# Patient Record
Sex: Female | Born: 1995 | Race: White | Hispanic: No | Marital: Single | Smoking: Never smoker
Health system: Southern US, Community
[De-identification: ages and names within clinical notes are randomized; demographics above are authoritative.]

---

## 2019-07-04 ENCOUNTER — Encounter (HOSPITAL_BASED_OUTPATIENT_CLINIC_OR_DEPARTMENT_OTHER): Payer: Self-pay

## 2019-07-04 ENCOUNTER — Emergency Department (HOSPITAL_BASED_OUTPATIENT_CLINIC_OR_DEPARTMENT_OTHER)
Admission: EM | Admit: 2019-07-04 | Discharge: 2019-07-05 | Disposition: A | Discharge status: Home / Self Care | Payer: Medicaid Other | Attending: Emergency Medicine | Admitting: Emergency Medicine

## 2019-07-04 ENCOUNTER — Emergency Department (HOSPITAL_BASED_OUTPATIENT_CLINIC_OR_DEPARTMENT_OTHER): Payer: Medicaid Other

## 2019-07-04 ENCOUNTER — Other Ambulatory Visit: Payer: Self-pay

## 2019-07-04 DIAGNOSIS — N12 Tubulo-interstitial nephritis, not specified as acute or chronic: Secondary | ICD-10-CM | POA: Insufficient documentation

## 2019-07-04 DIAGNOSIS — M549 Dorsalgia, unspecified: Secondary | ICD-10-CM | POA: Diagnosis present

## 2019-07-04 LAB — COMPREHENSIVE METABOLIC PANEL
ALT: 15 U/L (ref 0–44)
AST: 19 U/L (ref 15–41)
Albumin: 4.3 g/dL (ref 3.5–5.0)
Alkaline Phosphatase: 74 U/L (ref 38–126)
Anion gap: 10 (ref 5–15)
BUN: 11 mg/dL (ref 6–20)
CO2: 23 mmol/L (ref 22–32)
Calcium: 9 mg/dL (ref 8.9–10.3)
Chloride: 105 mmol/L (ref 98–111)
Creatinine, Ser: 0.65 mg/dL (ref 0.44–1.00)
GFR calc Af Amer: >60 mL/min (ref >60)
GFR calc non Af Amer: >60 mL/min (ref >60)
Glucose, Bld: 87 mg/dL (ref 70–99)
Potassium: 3.6 mmol/L (ref 3.5–5.1)
Sodium: 138 mmol/L (ref 135–145)
Total Bilirubin: 0.4 mg/dL (ref 0.3–1.2)
Total Protein: 7.6 g/dL (ref 6.5–8.1)

## 2019-07-04 LAB — URINALYSIS, MICROSCOPIC (REFLEX): WBC, UA: >50 WBC/hpf (ref 0–5)

## 2019-07-04 LAB — CBC WITH DIFFERENTIAL/PLATELET
Abs Immature Granulocytes: 0.04 10*3/uL (ref 0.00–0.07)
Basophils Absolute: 0.1 10*3/uL (ref 0.0–0.1)
Basophils Relative: 0 %
Eosinophils Absolute: 0.1 10*3/uL (ref 0.0–0.5)
Eosinophils Relative: 1 %
HCT: 40.1 % (ref 36.0–46.0)
Hemoglobin: 13.2 g/dL (ref 12.0–15.0)
Immature Granulocytes: 0 %
Lymphocytes Relative: 24 %
Lymphs Abs: 3 10*3/uL (ref 0.7–4.0)
MCH: 27.6 pg (ref 26.0–34.0)
MCHC: 32.9 g/dL (ref 30.0–36.0)
MCV: 83.7 fL (ref 80.0–100.0)
Monocytes Absolute: 0.6 10*3/uL (ref 0.1–1.0)
Monocytes Relative: 5 %
Neutro Abs: 8.7 10*3/uL — ABNORMAL HIGH (ref 1.7–7.7)
Neutrophils Relative %: 70 %
Platelets: 337 10*3/uL (ref 150–400)
RBC: 4.79 MIL/uL (ref 3.87–5.11)
RDW: 13.2 % (ref 11.5–15.5)
WBC: 12.4 10*3/uL — ABNORMAL HIGH (ref 4.0–10.5)
nRBC: 0 % (ref 0.0–0.2)

## 2019-07-04 LAB — PREGNANCY, URINE: Preg Test, Ur: NEGATIVE

## 2019-07-04 LAB — URINALYSIS, ROUTINE W REFLEX MICROSCOPIC
Bilirubin Urine: NEGATIVE
Glucose, UA: NEGATIVE mg/dL
Ketones, ur: NEGATIVE mg/dL
Nitrite: NEGATIVE
Protein, ur: 30 mg/dL — AB
Specific Gravity, Urine: 1.025 (ref 1.005–1.030)
pH: 6 (ref 5.0–8.0)

## 2019-07-04 MED ORDER — ONDANSETRON HCL 4 MG/2ML IJ SOLN
4.0000 mg | Freq: Once | INTRAMUSCULAR | Status: AC
  Administered 2019-07-04: 4 mg via INTRAVENOUS
  Filled 2019-07-04: qty 2

## 2019-07-04 MED ORDER — OXYCODONE-ACETAMINOPHEN 5-325 MG PO TABS
1.0000 | ORAL_TABLET | Freq: Once | ORAL | Status: AC
  Administered 2019-07-04: 21:00:00 1 via ORAL
  Filled 2019-07-04: qty 1

## 2019-07-04 MED ORDER — FENTANYL CITRATE (PF) 100 MCG/2ML IJ SOLN
100.0000 ug | Freq: Once | INTRAMUSCULAR | Status: AC
  Administered 2019-07-04: 100 ug via INTRAVENOUS
  Filled 2019-07-04: qty 2

## 2019-07-04 MED ORDER — CEPHALEXIN 500 MG PO CAPS: 500.0000 mg | ORAL_CAPSULE | Freq: Four times a day (QID) | ORAL | 0 refills | Status: AC

## 2019-07-04 MED ORDER — SODIUM CHLORIDE 0.9 % IV SOLN
1.0000 g | Freq: Once | INTRAVENOUS | Status: AC
  Administered 2019-07-04: 1 g via INTRAVENOUS
  Filled 2019-07-04: qty 10

## 2019-07-04 NOTE — ED Notes (Signed)
Ambulatory to treatment room

## 2019-07-04 NOTE — ED Triage Notes (Signed)
Pt reports that she only has a left kidney, states that she has had and passed a kidney stone in the past and this pain feels the same. Also reports that the pain is worse with urination.

## 2019-07-04 NOTE — ED Notes (Signed)
Pt reports using azo with no relief.

## 2019-07-04 NOTE — ED Notes (Signed)
ED Provider at bedside. 

## 2019-07-04 NOTE — ED Notes (Signed)
Patient transported to CT 

## 2019-07-04 NOTE — ED Provider Notes (Signed)
[facility] EMERGENCY DEPARTMENT Provider Note   CSN: [id number] Arrival date & time: 07/04/19  1706     History Chief Complaint  Patient presents with  . Back Pain    Kelly Duffy is a 23 y.o. female.  23 year old female with past medical history including kidney stone, congenital solitary kidney who presents with left flank pain.  Patient states that approximately 1 week ago she began having left flank plain as well as pain at the end of urination.  She is also noticed intermittent hematuria.  She reports that symptoms have been worsening since they began.  She has had nausea, no vomiting.  She has had chills but no measured fevers.  She denies any URI symptoms.  She has a distant history of kidney stone while pregnant and states that this feels similar.  When her symptoms first started, she started taking Azo thinking that it was a UTI but symptoms have progressed since then.  The history is provided by the patient.  Back Pain      History reviewed. No pertinent past medical history.  There are no problems to display for this patient.   History reviewed. No pertinent surgical history.   OB History   No obstetric history on file.     History reviewed. No pertinent family history.  Social History   Tobacco Use  . Smoking status: Never Smoker  . Smokeless tobacco: Never Used  Substance Use Topics  . Alcohol use: Never  . Drug use: Never    Home Medications Prior to Admission medications   Medication Sig Start Date End Date Taking? Authorizing Provider  cephALEXin (KEFLEX) 500 MG capsule Take 1 capsule (500 mg total) by mouth 4 (four) times daily for 10 days. 07/04/19 07/14/19  Miray Mancino, Ambrose Finlandachel Morgan, MD    Allergies    Medroxyprogesterone acetate, Metronidazole, and Medroxyprogesterone  Review of Systems   Review of Systems  Musculoskeletal: Positive for back pain.   All other systems reviewed and are negative except that which was mentioned  in HPI  Physical Exam Updated Vital Signs BP (!) 103/48 (BP Location: Right Arm)   Pulse 80   Temp 98.4 F (36.9 C) (Oral)   Resp 20   Ht 5\' 9"  (1.753 m)   Wt 94.3 kg   LMP 06/27/2019 Comment: (-) urine prg//ac  SpO2 100%   BMI 30.72 kg/m   Physical Exam Vitals and nursing note reviewed.  Constitutional:      General: She is not in acute distress.    Appearance: She is well-developed.  HENT:     Head: Normocephalic and atraumatic.  Eyes:     Conjunctiva/sclera: Conjunctivae normal.  Cardiovascular:     Rate and Rhythm: Normal rate and regular rhythm.     Heart sounds: Normal heart sounds. No murmur.  Pulmonary:     Effort: Pulmonary effort is normal.     Breath sounds: Normal breath sounds.  Abdominal:     General: Bowel sounds are normal. There is no distension.     Palpations: Abdomen is soft.     Tenderness: There is no abdominal tenderness. There is left CVA tenderness.  Musculoskeletal:     Cervical back: Neck supple.  Skin:    General: Skin is warm and dry.  Neurological:     Mental Status: She is alert and oriented to person, place, and time.     Comments: Fluent speech  Psychiatric:        Judgment: Judgment normal.  ED Results / Procedures / Treatments   Labs (all labs ordered are listed, but only abnormal results are displayed) Labs Reviewed  URINALYSIS, ROUTINE W REFLEX MICROSCOPIC - Abnormal; Notable for the following components:      Result Value   APPearance CLOUDY (*)    Hgb urine dipstick MODERATE (*)    Protein, ur 30 (*)    Leukocytes,Ua SMALL (*)    All other components within normal limits  URINALYSIS, MICROSCOPIC (REFLEX) - Abnormal; Notable for the following components:   Bacteria, UA FEW (*)    All other components within normal limits  CBC WITH DIFFERENTIAL/PLATELET - Abnormal; Notable for the following components:   WBC 12.4 (*)    Neutro Abs 8.7 (*)    All other components within normal limits  URINE CULTURE  PREGNANCY,  URINE  COMPREHENSIVE METABOLIC PANEL    EKG None  Radiology CT Renal Stone Study  Result Date: 07/04/2019 CLINICAL DATA:  Left flank pain EXAM: CT ABDOMEN AND PELVIS WITHOUT CONTRAST TECHNIQUE: Multidetector CT imaging of the abdomen and pelvis was performed following the standard protocol without IV contrast. COMPARISON:  None. FINDINGS: Lower chest: The visualized heart size within normal limits. No pericardial fluid/thickening. No hiatal hernia. The visualized portions of the lungs are clear. Hepatobiliary: Although limited due to the lack of intravenous contrast, normal in appearance without gross focal abnormality. No evidence of calcified gallstones or biliary ductal dilatation. Pancreas:  Unremarkable.  No surrounding inflammatory changes. Spleen: Normal in size. Although limited due to the lack of intravenous contrast, normal in appearance. Adrenals/Urinary Tract: Both adrenal glands appear normal. There is a punctate 2 mm calculus seen in the lower pole of the left kidney. No hydronephrosis is seen. No collecting system or bladder calculi are seen. There is congenital absence of the right kidney. Stomach/Bowel: The stomach, small bowel, and colon are normal in appearance. No inflammatory changes or obstructive findings. appendix is normal. Vascular/Lymphatic: There are no enlarged abdominal or pelvic lymph nodes. No significant gross vascular findings are present. 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 Other: No evidence of abdominal wall mass or hernia. Musculoskeletal: No acute or significant osseous findings. IMPRESSION: Nonobstructing 2 mm left renal calculus. Congenital absence of the right kidney. Electronically Signed   By: Jonna Clark M.D.   On: 07/04/2019 20:31    Procedures Procedures (including critical care time)  Medications Ordered in ED Medications  fentaNYL (SUBLIMAZE) injection 100 mcg (100 mcg Intravenous Given 07/04/19 1941)  ondansetron  (ZOFRAN) injection 4 mg (4 mg Intravenous Given 07/04/19 1941)  cefTRIAXone (ROCEPHIN) 1 g in sodium chloride 0.9 % 100 mL IVPB (0 g Intravenous Stopped 07/04/19 2133)  oxyCODONE-acetaminophen (PERCOCET/ROXICET) 5-325 MG per tablet 1 tablet (1 tablet Oral Given 07/04/19 2108)    ED Course  I have reviewed the triage vital signs and the nursing notes.  Pertinent labs & imaging results that were available during my care of the patient were reviewed by me and considered in my medical decision making (see chart for details).    MDM Rules/Calculators/A&P                      Afebrile and well-appearing on exam, left CVA tenderness noted.  Differential includes kidney stone, pyelonephritis, infected stone, less likely diverticulitis.  UA shows small leukocytes, large amount of WBCs and clumps with a few bacteria.  Gave ceftriaxone.  Obtain CT renal study to evaluate for infected stone.  CT shows no ureteral stones  or hydronephrosis.  Patient well-appearing on reassessment.  Her vital signs have remained normal here and she has been afebrile.  I had a long discussion with the patient and her mother (over the phone) regarding treatment options.  I discussed that because she has solitary kidney and has symptoms suggestive of pyelonephritis, we could admit her overnight for observation with continued antibiotics.  Alternatively, I discussed risks and benefits of treatment at home with close PCP follow-up and return if any symptoms worsen.  They voiced understanding of options and risks/benefits and ultimately patient decided to go home with outpatient treatment.  I have provided with Keflex and emphasized the importance of return precautions.  Patient voiced understanding. Final Clinical Impression(s) / ED Diagnoses Final diagnoses:  Pyelonephritis of left kidney    Rx / DC Orders ED Discharge Orders         Ordered    cephALEXin (KEFLEX) 500 MG capsule  4 times daily     07/04/19 2313             Kaylena Pacifico, Wenda Overland, MD 07/04/19 2315

## 2019-07-07 LAB — URINE CULTURE: Culture: >=100000 — AB

## 2019-07-08 ENCOUNTER — Telehealth: Payer: Self-pay | Admitting: Emergency Medicine

## 2019-07-08 NOTE — Progress Notes (Signed)
ED Antimicrobial Stewardship Positive Culture Follow Up   Kelly Duffy is an 23 y.o. female who presented to [facility] on 07/04/2019 with a chief complaint of  Chief Complaint  Patient presents with   Back Pain    Recent Results (from the past 720 hour(s))  Urine culture     Status: Abnormal   Collection Time: 07/04/19  5:25 PM   Specimen: Urine, Random  Result Value Ref Range Status   Specimen Description   Final    URINE, RANDOM Performed at [facility], [address] [place], [place] [address]    Special Requests   Final    NONE Performed at [facility], [address] [place], [place] [address]    Culture >=100,000 COLONIES/mL STAPHYLOCOCCUS SAPROPHYTICUS (A)  Final   Report Status 07/07/2019 FINAL  Final   Organism ID, Bacteria STAPHYLOCOCCUS SAPROPHYTICUS (A)  Final      Susceptibility   Staphylococcus saprophyticus - MIC*    CIPROFLOXACIN <=0.5 SENSITIVE Sensitive     GENTAMICIN <=0.5 SENSITIVE Sensitive     NITROFURANTOIN <=16 SENSITIVE Sensitive     OXACILLIN 2 RESISTANT Resistant     TETRACYCLINE <=1 SENSITIVE Sensitive     VANCOMYCIN <=0.5 SENSITIVE Sensitive     TRIMETH/SULFA <=10 SENSITIVE Sensitive     CLINDAMYCIN >=8 RESISTANT Resistant     RIFAMPIN <=0.5 SENSITIVE Sensitive     Inducible Clindamycin NEGATIVE Sensitive     * >=100,000 COLONIES/mL STAPHYLOCOCCUS SAPROPHYTICUS    [x]  Treated with Keflex, organism resistant to prescribed antimicrobial  New antibiotic prescription: Stop Keflex. Start Bactrim 1 DS tablet PO BID x 14 days  ED Provider: Benedetto Goad, PA-C   [name] 07/08/2019, 7:57 AM Clinical Pharmacist Monday - Friday phone -  [phone] Saturday - Sunday phone - [phone]

## 2019-07-08 NOTE — Telephone Encounter (Signed)
Post ED Visit - Positive Culture Follow-up: Successful Patient Follow-Up  Culture assessed and recommendations reviewed by:  []  Elenor Quinones, Pharm.D. []  Heide Guile, Pharm.D., BCPS AQ-ID []  Parks Neptune, Pharm.D., BCPS []  Alycia Rossetti, Pharm.D., BCPS []  [place], Pharm.D., BCPS, AAHIVP []  Legrand Como, Pharm.D., BCPS, AAHIVP []  Salome Arnt, PharmD, BCPS []  Johnnette Gourd, PharmD, BCPS []  Hughes Better, PharmD, BCPS []  Leeroy Cha, PharmD Elicia Lamp PharmD  Positive urine culture  []  Patient discharged without antimicrobial prescription and treatment is now indicated [x]  Organism is resistant to prescribed ED discharge antimicrobial []  Patient with positive blood cultures  Changes discussed with ED provider: Benedetto Goad PA New antibiotic prescription stop keflex start bactrim 1 ds po tablet bid x 14 days  Attempting to contact patient   Hazle Nordmann 07/08/2019, 12:30 PM

## 2020-10-17 IMAGING — CT CT RENAL STONE PROTOCOL
2 of 4 series · 16 of 46 positions shown, 18 images · non-contrast
Comparison: None.

CLINICAL DATA: Left flank pain

EXAM:
CT ABDOMEN AND PELVIS WITHOUT CONTRAST
TECHNIQUE: Multidetector CT imaging of the abdomen and pelvis was performed
following the standard protocol without IV contrast.

[Series 2: axial st · axial · 0.86mm/px · z∈[-511,-71]mm · 13 of 96 slices shown, 15 images]
[im 4/96  soft-tissue]
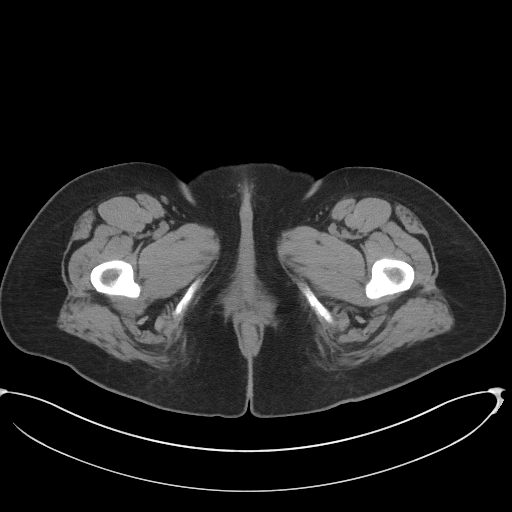
[im 4/96  bone]
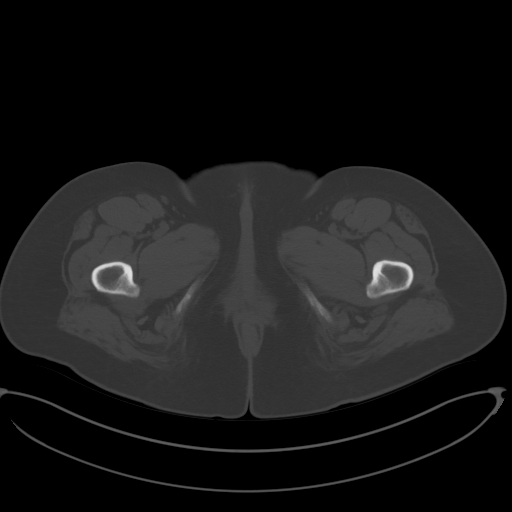
[im 12/96  soft-tissue]
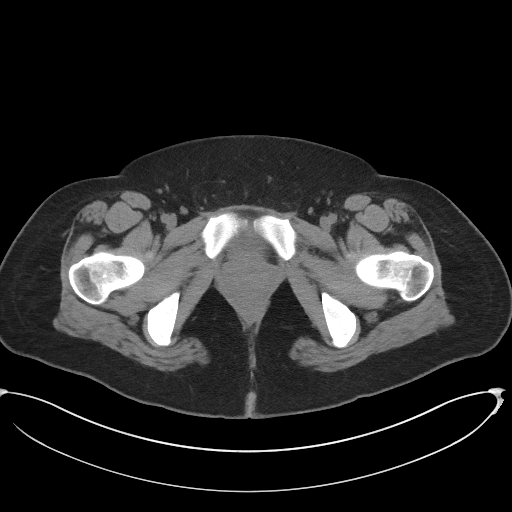
[im 20/96  soft-tissue]
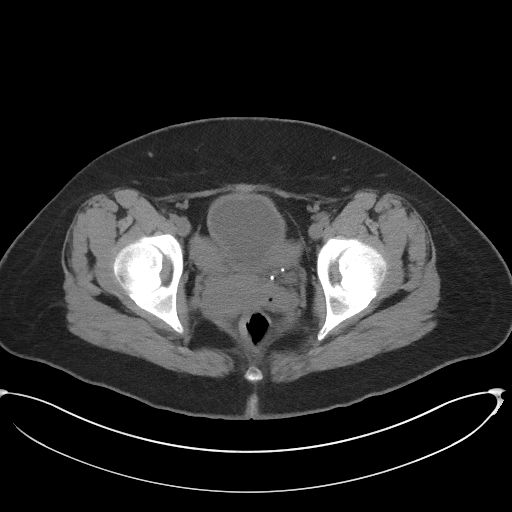
[im 28/96  soft-tissue]
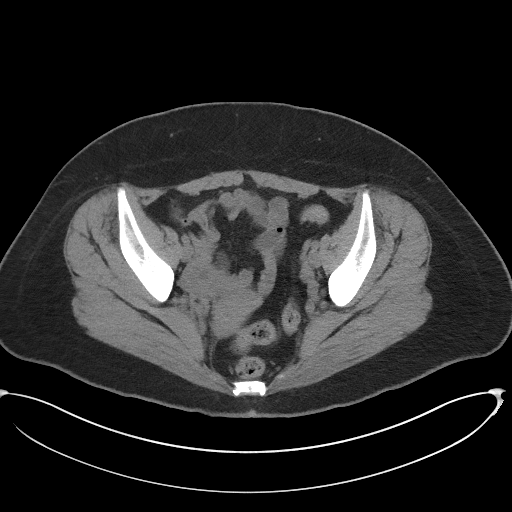
[im 32/96  soft-tissue]
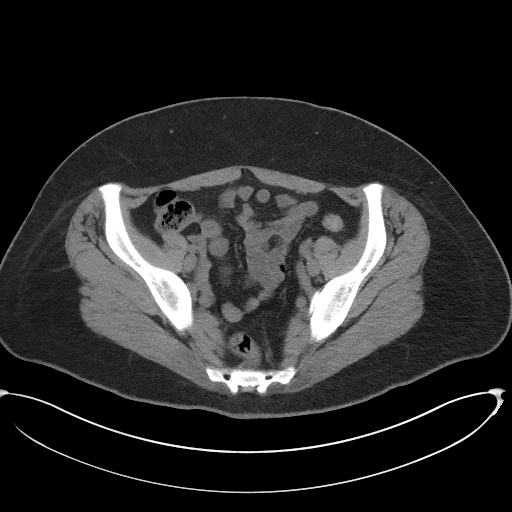
[im 40/96  soft-tissue]
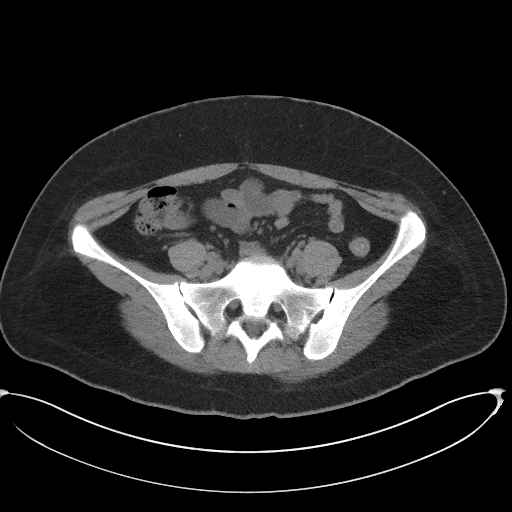
[im 48/96  soft-tissue]
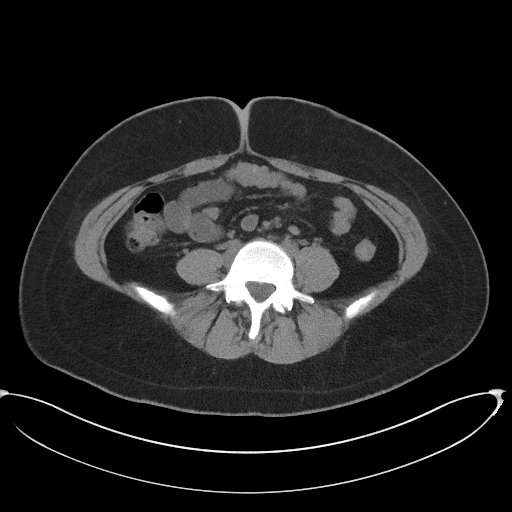
[im 56/96  soft-tissue]
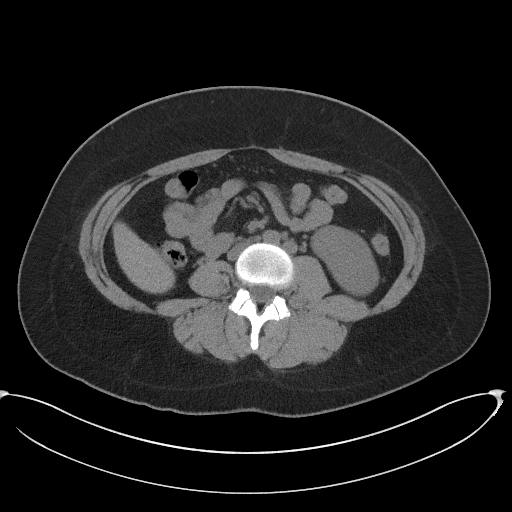
[im 64/96  soft-tissue]
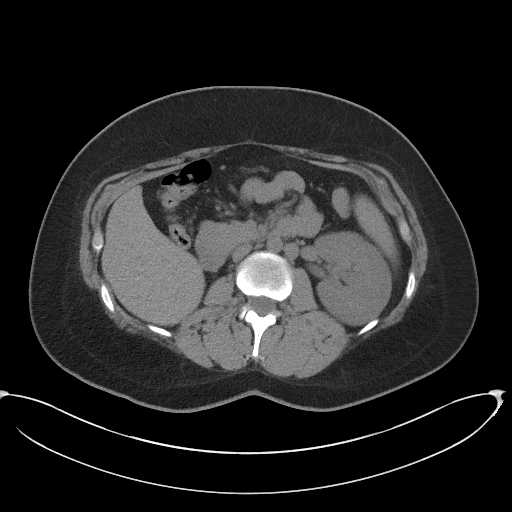
[im 64/96  bone]
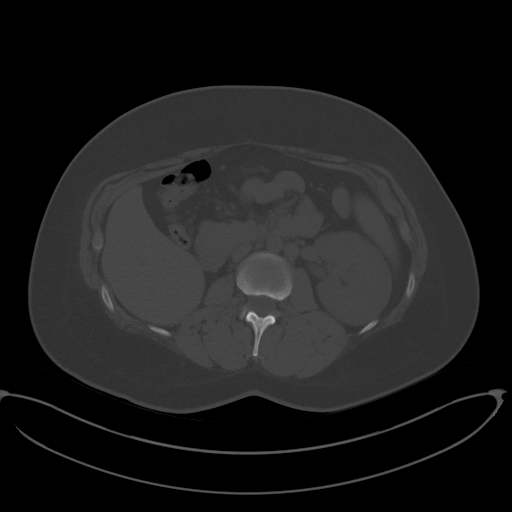
[im 68/96  soft-tissue]
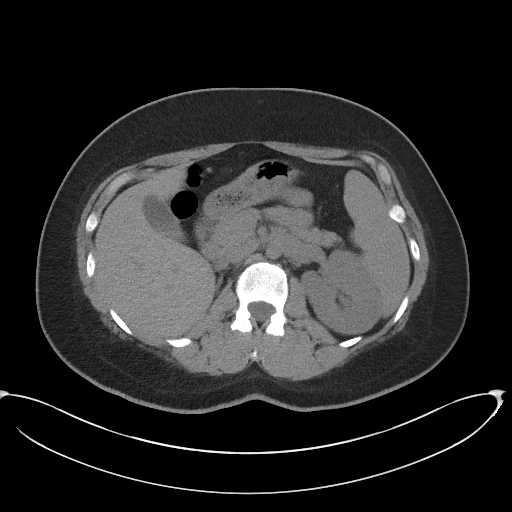
[im 76/96  soft-tissue]
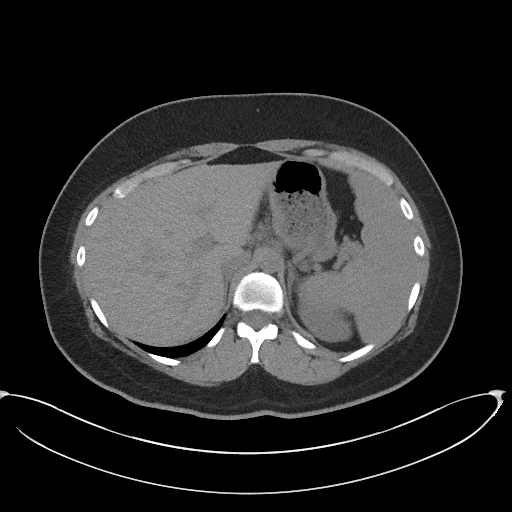
[im 84/96  soft-tissue]
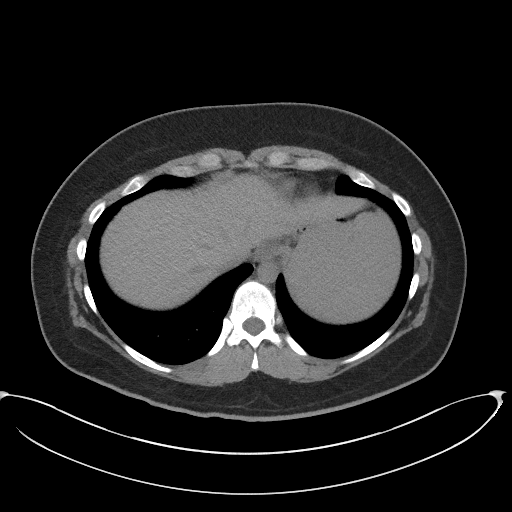
[im 92/96  soft-tissue]
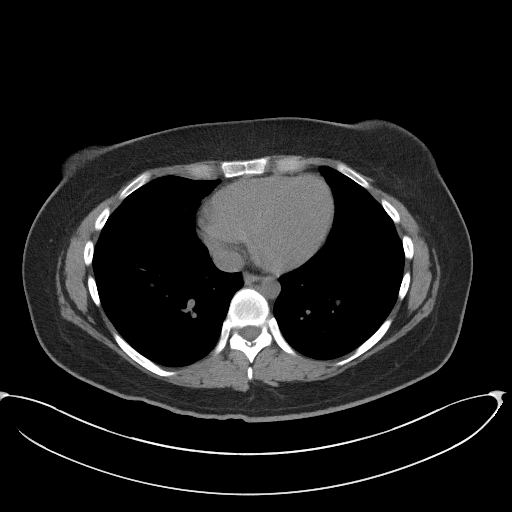

[Series 4: coronal st · coronal · 0.84mm/px · 3 of 85 slices shown]
[im 29/85  soft-tissue]
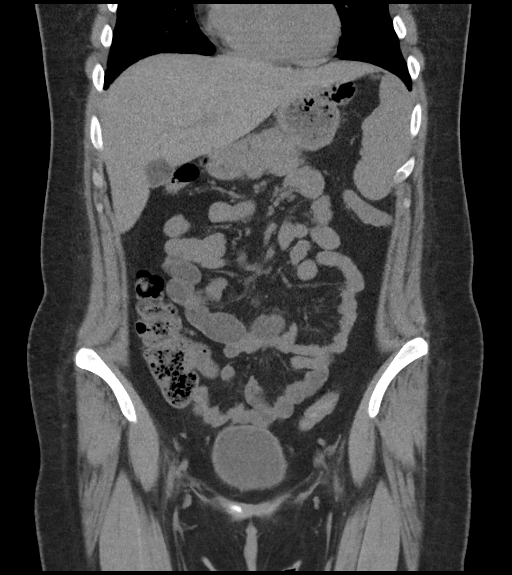
[im 38/85  soft-tissue]
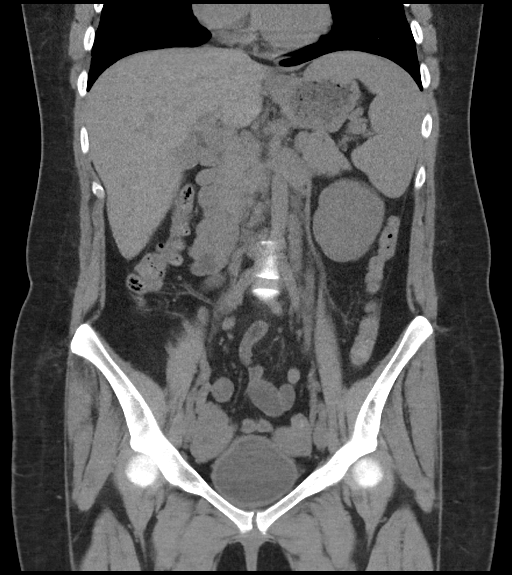
[im 47/85  soft-tissue]
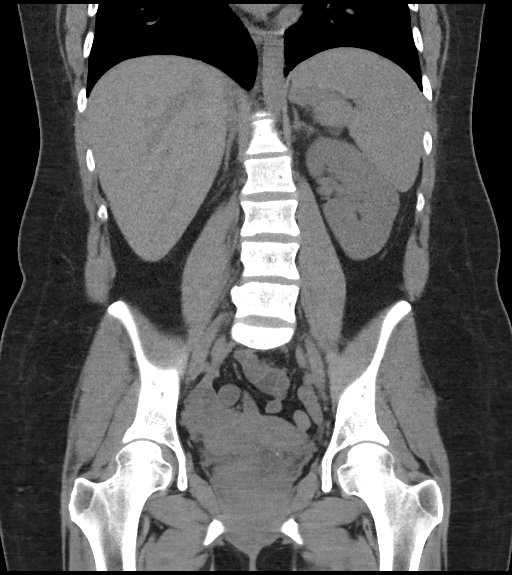

[16 of 46 positions shown; findings below may reference images not displayed]

FINDINGS: Lower chest: The visualized heart size within normal limits. No
pericardial fluid/thickening.

No hiatal hernia.

The visualized portions of the lungs are clear.

Hepatobiliary: Although limited due to the lack of intravenous
contrast, normal in appearance without gross focal abnormality. No
evidence of calcified gallstones or biliary ductal dilatation.

Pancreas:  Unremarkable.  No surrounding inflammatory changes.

Spleen: Normal in size. Although limited due to the lack of
intravenous contrast, normal in appearance.

Adrenals/Urinary Tract: Both adrenal glands appear normal. There is
a punctate 2 mm calculus seen in the lower pole of the left kidney.
No hydronephrosis is seen. No collecting system or bladder calculi
are seen. There is congenital absence of the right kidney.

Stomach/Bowel: The stomach, small bowel, and colon are normal in
appearance. No inflammatory changes or obstructive findings.
appendix is normal.

Vascular/Lymphatic: There are no enlarged abdominal or pelvic lymph
nodes. No significant gross vascular findings are present.

Reproductive: Probable uterine didelphys is again identified.

Other: No evidence of abdominal wall mass or hernia.

Musculoskeletal: No acute or significant osseous findings.
IMPRESSION: Nonobstructing 2 mm left renal calculus.

Congenital absence of the right kidney.
